# Patient Record
Sex: Male | Born: 1995 | Hispanic: No | Marital: Single | Smoking: Never smoker
Health system: Southern US, Community
[De-identification: ages and names within clinical notes are randomized; demographics above are authoritative.]

---
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
## 2013-09-01 ENCOUNTER — Emergency Department (HOSPITAL_COMMUNITY): Payer: BC Managed Care – PPO

## 2013-09-01 ENCOUNTER — Emergency Department (HOSPITAL_COMMUNITY)
Admission: EM | Admit: 2013-09-01 | Discharge: 2013-09-01 | Disposition: A | Discharge status: Home / Self Care | Payer: BC Managed Care – PPO | Attending: Emergency Medicine | Admitting: Emergency Medicine

## 2013-09-01 ENCOUNTER — Encounter (HOSPITAL_COMMUNITY): Payer: Self-pay | Admitting: Emergency Medicine

## 2013-09-01 DIAGNOSIS — S0100XA Unspecified open wound of scalp, initial encounter: Secondary | ICD-10-CM | POA: Insufficient documentation

## 2013-09-01 DIAGNOSIS — S0101XA Laceration without foreign body of scalp, initial encounter: Secondary | ICD-10-CM

## 2013-09-01 DIAGNOSIS — Y9389 Activity, other specified: Secondary | ICD-10-CM | POA: Insufficient documentation

## 2013-09-01 DIAGNOSIS — Y92410 Unspecified street and highway as the place of occurrence of the external cause: Secondary | ICD-10-CM | POA: Insufficient documentation

## 2013-09-01 NOTE — ED Notes (Signed)
Returned from CT.
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
## 2013-09-01 NOTE — ED Provider Notes (Signed)
Medical screening examination/treatment/procedure(s) were performed by non-physician practitioner and as supervising physician I was immediately available for consultation/collaboration.  EKG Interpretation   None        Juliet Rude. Rubin Payor, MD 09/01/13 [phone]
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
## 2013-09-08 ENCOUNTER — Emergency Department (HOSPITAL_COMMUNITY)
Admission: EM | Admit: 2013-09-08 | Discharge: 2013-09-08 | Disposition: A | Discharge status: Home / Self Care | Payer: BC Managed Care – PPO | Attending: Emergency Medicine | Admitting: Emergency Medicine

## 2013-09-08 ENCOUNTER — Encounter (HOSPITAL_COMMUNITY): Payer: Self-pay | Admitting: Emergency Medicine

## 2013-09-08 DIAGNOSIS — Z4802 Encounter for removal of sutures: Secondary | ICD-10-CM | POA: Insufficient documentation

## 2013-09-08 NOTE — ED Notes (Signed)
Pt reports he needs to have staples removed from his head that were placed last week.

## 2013-09-08 NOTE — ED Provider Notes (Signed)
CSN: [id number]     Arrival date & time 09/08/13  7846 History   First MD Initiated Contact with Patient 09/08/13 [phone]     Chief Complaint  Patient presents with  . Suture / Staple Removal   (Consider location/radiation/quality/duration/timing/severity/associated sxs/prior Treatment) Patient is a 17 y.o. male presenting with suture removal.  Suture / Staple Removal This is a new problem. The current episode started in the past 7 days. The problem occurs constantly. The problem has been unchanged. Nothing aggravates the symptoms. He has tried nothing for the symptoms.  Pt here for staple removal  History reviewed. No pertinent past medical history. History reviewed. No pertinent past surgical history. History reviewed. No pertinent family history. History  Substance Use Topics  . Smoking status: Never Smoker   . Smokeless tobacco: Not on file  . Alcohol Use: No    Review of Systems  Skin: Positive for wound.  All other systems reviewed and are negative.    Allergies  Review of patient's allergies indicates no known allergies.  Home Medications  No current outpatient prescriptions on file. BP 129/73  Pulse 72  Temp(Src) 97.6 F (36.4 C) (Oral)  Resp 18  SpO2 100% Physical Exam  Constitutional: He is oriented to person, place, and time. He appears well-developed and well-nourished.  HENT:  Head: Normocephalic and atraumatic.  Neurological: He is alert and oriented to person, place, and time.  Skin: Skin is warm.  Healed stapled areas scalp   Psychiatric: He has a normal mood and affect.    ED Course  Procedures (including critical care time) Labs Review Labs Reviewed - No data to display Imaging Review No results found.  EKG Interpretation   None       MDM   1. Removal of staple        Elson Areas, [place] 09/08/13 5284

## 2013-09-09 NOTE — ED Provider Notes (Signed)
Medical screening examination/treatment/procedure(s) were performed by non-physician practitioner and as supervising physician I was immediately available for consultation/collaboration.  EKG Interpretation   None        Derwood Kaplan, MD 09/09/13 [phone]
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
## 2015-03-05 ENCOUNTER — Emergency Department (HOSPITAL_BASED_OUTPATIENT_CLINIC_OR_DEPARTMENT_OTHER)
Admission: EM | Admit: 2015-03-05 | Discharge: 2015-03-05 | Disposition: A | Discharge status: Home / Self Care | Payer: Worker's Compensation | Attending: Emergency Medicine | Admitting: Emergency Medicine

## 2015-03-05 ENCOUNTER — Encounter (HOSPITAL_BASED_OUTPATIENT_CLINIC_OR_DEPARTMENT_OTHER): Payer: Self-pay | Admitting: *Deleted

## 2015-03-05 ENCOUNTER — Emergency Department (HOSPITAL_BASED_OUTPATIENT_CLINIC_OR_DEPARTMENT_OTHER): Payer: Worker's Compensation

## 2015-03-05 DIAGNOSIS — W272XXA Contact with scissors, initial encounter: Secondary | ICD-10-CM | POA: Insufficient documentation

## 2015-03-05 DIAGNOSIS — Y9389 Activity, other specified: Secondary | ICD-10-CM | POA: Diagnosis not present

## 2015-03-05 DIAGNOSIS — Y990 Civilian activity done for income or pay: Secondary | ICD-10-CM | POA: Diagnosis not present

## 2015-03-05 DIAGNOSIS — Y9289 Other specified places as the place of occurrence of the external cause: Secondary | ICD-10-CM | POA: Diagnosis not present

## 2015-03-05 DIAGNOSIS — S61412A Laceration without foreign body of left hand, initial encounter: Secondary | ICD-10-CM | POA: Insufficient documentation

## 2015-03-05 MED ORDER — LIDOCAINE HCL (PF) 1 % IJ SOLN
5.0000 mL | Freq: Once | INTRAMUSCULAR | Status: AC
  Administered 2015-03-05: 5 mL
  Filled 2015-03-05: qty 5

## 2015-03-05 NOTE — Discharge Instructions (Signed)
Have the sutures removed in 10 days by the nurse at work. Follow up sooner for any signs of infection Laceration Care, Adult A laceration is a cut that goes through all layers of the skin. The cut goes into the tissue beneath the skin. HOME CARE For stitches (sutures) or staples:  Keep the cut clean and dry.  If you have a bandage (dressing), change it at least once a day. Change the bandage if it gets wet or dirty, or as told by your doctor.  Wash the cut with soap and water 2 times a day. Rinse the cut with water. Pat it dry with a clean towel.  Put a thin layer of medicated cream on the cut as told by your doctor.  You may shower after the first 24 hours. Do not soak the cut in water until the stitches are removed.  Only take medicines as told by your doctor.  Have your stitches or staples removed as told by your doctor. For skin adhesive strips:  Keep the cut clean and dry.  Do not get the strips wet. You may take a bath, but be careful to keep the cut dry.  If the cut gets wet, pat it dry with a clean towel.  The strips will fall off on their own. Do not remove the strips that are still stuck to the cut. For wound glue:  You may shower or take baths. Do not soak or scrub the cut. Do not swim. Avoid heavy sweating until the glue falls off on its own. After a shower or bath, pat the cut dry with a clean towel.  Do not put medicine on your cut until the glue falls off.  If you have a bandage, do not put tape over the glue.  Avoid lots of sunlight or tanning lamps until the glue falls off. Put sunscreen on the cut for the first year to reduce your scar.  The glue will fall off on its own. Do not pick at the glue. You may need a tetanus shot if:  You cannot remember when you had your last tetanus shot.  You have never had a tetanus shot. If you need a tetanus shot and you choose not to have one, you may get tetanus. Sickness from tetanus can be serious. GET HELP RIGHT  AWAY IF:   Your pain does not get better with medicine.  Your arm, hand, leg, or foot loses feeling (numbness) or changes color.  Your cut is bleeding.  Your joint feels weak, or you cannot use your joint.  You have painful lumps on your body.  Your cut is red, puffy (swollen), or painful.  You have a red line on the skin near the cut.  You have yellowish-white fluid (pus) coming from the cut.  You have a fever.  You have a bad smell coming from the cut or bandage.  Your cut breaks open before or after stitches are removed.  You notice something coming out of the cut, such as wood or glass.  You cannot move a finger or toe. MAKE SURE YOU:   Understand these instructions.  Will watch your condition.  Will get help right away if you are not doing well or get worse. Document Released: 03/07/2008 Document Revised: 12/12/2011 Document Reviewed: 03/15/2011 [facility] Patient Information 2015 [employer], [place]. This information is not intended to replace advice given to you by your health care provider. Make sure you discuss any questions you have with your health care  provider.

## 2015-03-05 NOTE — ED Notes (Signed)
Workersman comp.  Pt cut his left pinky finger at work.

## 2015-03-05 NOTE — ED Provider Notes (Signed)
CSN: [id number]     Arrival date & time 03/05/15  1936 History   First MD Initiated Contact with Patient 03/05/15 2024     Chief Complaint  Patient presents with  . Extremity Laceration     (Consider location/radiation/quality/duration/timing/severity/associated sxs/prior Treatment) HPI Comments: Pt comes in with c/o laceration to the left hand. Pt states that he was cutting with scissors and cut his hand. Denies problems with movement of finger. No numbness or weakness. tdap is utd  The history is provided by the patient. No language interpreter was used.    History reviewed. No pertinent past medical history. History reviewed. No pertinent past surgical history. History reviewed. No pertinent family history. History  Substance Use Topics  . Smoking status: Never Smoker   . Smokeless tobacco: Not on file  . Alcohol Use: No    Review of Systems  All other systems reviewed and are negative.     Allergies  Review of patient's allergies indicates no known allergies.  Home Medications   Prior to Admission medications   Not on File   BP 126/74 mmHg  Pulse 90  Temp(Src) 98.9 F (37.2 C) (Oral)  Resp 18  Ht 6' (1.829 m)  Wt 150 lb (68.04 kg)  BMI 20.34 kg/m2  SpO2 100% Physical Exam  Constitutional: He is oriented to person, place, and time. He appears well-developed and well-nourished.  Cardiovascular: Normal rate and regular rhythm.   Pulmonary/Chest: Effort normal and breath sounds normal.  Neurological: He is alert and oriented to person, place, and time. He exhibits normal muscle tone. Coordination normal.  Skin:  Laceration to the left dorsal aspect of the right hand. Full rom of all finger. Neurovascularly intact  Nursing note and vitals reviewed.   ED Course  LACERATION REPAIR Date/Time: 03/05/2015 9:35 PM Performed by: Teressa LowerPICKERING, Ori Trejos Authorized by: Teressa LowerPICKERING, Eiden Bagot Consent: Verbal consent obtained. Consent given by: patient Patient identity  confirmed: verbally with patient Body area: upper extremity Location details: left hand Laceration length: 2 cm Foreign bodies: no foreign bodies Anesthesia: local infiltration Local anesthetic: lidocaine 2% without epinephrine Irrigation solution: saline Irrigation method: syringe Amount of cleaning: standard Skin closure: 4-0 Prolene Number of sutures: 4 Technique: simple Approximation: close Approximation difficulty: simple Dressing: 4x4 sterile gauze Patient tolerance: Patient tolerated the procedure well with no immediate complications   (including critical care time) Labs Review Labs Reviewed - No data to display  Imaging Review Dg Hand Complete Left  03/05/2015   CLINICAL DATA:  Left hand pain after a scissor injury. Laceration along the palm.  EXAM: LEFT HAND - COMPLETE 3+ VIEW  COMPARISON:  None.  FINDINGS: No foreign body aside from the bandaging. No unexpected gas tracking along the soft tissues. No bony abnormality.  IMPRESSION: 1. No bony abnormality or gas identified in the soft tissues, although soft tissue detail is obscured by the patient's bandaging. No foreign body.   Electronically Signed   By: Gaylyn RongWalter  Liebkemann M.D.   On: 03/05/2015 21:12     EKG Interpretation None      MDM   Final diagnoses:  Hand laceration, left, initial encounter    Wound closed without any problem. No sign of tendon injury. Discussed wound care and return precautions with pt    Teressa LowerVrinda Kensy Blizard, NP 03/05/15 2136  Vanetta MuldersScott Zackowski, MD 03/05/15 2320

## 2015-03-05 NOTE — ED Notes (Signed)
U[facility] completed  
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
## 2016-10-17 ENCOUNTER — Ambulatory Visit (INDEPENDENT_AMBULATORY_CARE_PROVIDER_SITE_OTHER): Payer: BLUE CROSS/BLUE SHIELD | Admitting: Physician Assistant

## 2016-10-17 VITALS — BP 122/80 | HR 81 | Temp 97.5°F | Resp 16 | Ht 71.0 in | Wt 144.0 lb

## 2016-10-17 DIAGNOSIS — L600 Ingrowing nail: Secondary | ICD-10-CM

## 2016-10-17 MED ORDER — IBUPROFEN 800 MG PO TABS: 800.0000 mg | ORAL_TABLET | Freq: Three times a day (TID) | ORAL | 0 refills | Status: AC | PRN

## 2016-10-17 MED ORDER — CEPHALEXIN 500 MG PO CAPS: 500.0000 mg | ORAL_CAPSULE | Freq: Two times a day (BID) | ORAL | 0 refills | Status: AC

## 2016-10-17 NOTE — Patient Instructions (Addendum)
Please keep the xeroform (yellow gauze) on the toe nail as long as possible.  You will shower as you normally do, and just pat the toe dry.   Take the antibiotic as needed.   Keep covered for the next 24 hours, then you can shower with the toe exposed.  Fingernail or Toenail Removal, Adult, Care After This sheet gives you information about how to care for yourself after your procedure. Your health care provider may also give you more specific instructions. If you have problems or questions, contact your health care provider. What can I expect after the procedure? After the procedure, it is common to have:  Pain.  Redness.  Swelling.  Soreness. Follow these instructions at home:  If you have a splint:  Do not put pressure on any part of the splint until it is fully hardened. This may take several hours.  Wear the splint as told by your health care provider. Remove it only as told by your health care provider.  Loosen the splint if your fingers or toes tingle, become numb, or turn cold and blue.  Keep the splint clean.  If the splint is not waterproof:  Do not let it get wet.  Cover it with a watertight covering when you take a bath or a shower. Wound care  Follow instructions from your health care provider about how to take care of your wound. Make sure you:  Wash your hands with soap and water before you change your bandage (dressing). If soap and water are not available, use hand sanitizer.  Change your dressing as told by your health care provider.  Keep your dressing dry until your health care provider says it can be removed.  Leave stitches (sutures), skin glue, or adhesive strips in place. These skin closures may need to stay in place for 2 weeks or longer. If adhesive strip edges start to loosen and curl up, you may trim the loose edges. Do not remove adhesive strips completely unless your health care provider tells you to do that.  Check your wound every day for  signs of infection. Check for:  More redness, swelling, or pain.  More fluid or blood.  Warmth.  Pus or a bad smell. Managing pain, stiffness, and swelling  Move your fingers or toes often to avoid stiffness and to lessen swelling.  Raise (elevate) the injured area above the level of your heart while you are sitting or lying down. You may need to keep your finger or toe raised or supported on a pillow for 24 hours or as told by your health care provider.  Soak your hand or foot in warm, soapy water for 10-20 minutes, 3 times a day or as told by your health care provider. Medicine  Take over-the-counter and prescription medicines only as told by your health care provider.  If you were prescribed an antibiotic medicine, use it as told by your health care provider. Do not stop using the antibiotic even if your condition improves. General instructions  If you were given a shoe to wear, wear it as told by your health care provider.  Keep all follow-up visits as told by your health care provider. This is important. Contact a health care provider if:  You have more redness, swelling, or pain around your wound.  You have more fluid or blood coming from your wound.  Your wound feels warm to the touch.  You have pus or a bad smell coming from your wound.  You  have a fever.  Your finger or toe looks blue or black. This information is not intended to replace advice given to you by your health care provider. Make sure you discuss any questions you have with your health care provider. Document Released: 10/10/2014 Document Revised: 05/18/2016 Document Reviewed: 03/28/2016 Elsevier Interactive Patient Education  2017 [employer]     IF you received an x-ray today, you will receive an invoice from [facility]. Please contact [facility] at [phone] with questions or concerns regarding your invoice.   IF you received labwork today, you will receive an invoice  from [facility]. Please contact [facility] at [phone] with questions or concerns regarding your invoice.   Our billing staff will not be able to assist you with questions regarding bills from these companies.  You will be contacted with the lab results as soon as they are available. The fastest way to get your results is to activate your My Chart account. Instructions are located on the last page of this paperwork. If you have not heard from us regarding the results in 2 weeks, please contact this office.

## 2016-10-17 NOTE — Progress Notes (Addendum)
[facility] [address], [place] [address] [phone]  Date:  10/17/2016   Name:  Gene Gilbert   DOB:  05-Jul-1996   MRN:  [id number]  PCP:  No PCP Per Patient    History of Present Illness:  Gene Gilbert is a 21 y.o. male patient who presents to [facility] for chief complaint of left great toe pain. Patient reports that his great toe on the left side has been agitated and painful intermittently for the last year. He reports that he has ingrown toenails that have been pretty frequently. He generally cuts them down. In the last month, he has noticed increased pain so, swelling, and redness to the area. The toe bleeds often. He has no fever or red streaking. He has noticed swelling.     There are no active problems to display for this patient.   No past medical history on file.  No past surgical history on file.  Social History  Substance Use Topics  . Smoking status: Never Smoker  . Smokeless tobacco: Never Used  . Alcohol use No    No family history on file.  No Known Allergies  Medication list has been reviewed and updated.  No current outpatient prescriptions on file prior to visit.   No current facility-administered medications on file prior to visit.     ROS ROS otherwise unremarkable unless listed above.   Physical Examination: BP 122/80 (BP Location: Right Arm, Patient Position: Sitting, Cuff Size: Normal)   Pulse 81   Temp 97.5 F (36.4 C) (Oral)   Resp 16   Ht 5\' 11"  (1.803 m)   Wt 144 lb (65.3 kg)   SpO2 100%   BMI 20.08 kg/m  Ideal Body Weight: Weight in (lb) to have BMI = 25: 178.9  Physical Exam  Constitutional: He is oriented to person, place, and time. He appears well-developed and well-nourished. No distress.  HENT:  Head: Normocephalic and atraumatic.  Eyes: Conjunctivae and EOM are normal. Pupils are equal, round, and reactive to light.  Cardiovascular: Normal rate.   Pulmonary/Chest: Effort normal. No respiratory  distress.  Musculoskeletal:  Left great toe: left side of the nail bed with erythema and dry sanguinous material.  Granulomatous tissue along the left side of the nail bed that appears overgrown consistent with an ingrown toenail. This is tender. There is also clear drainage from the more proximal area of the nail bed.   Neurological: He is alert and oriented to person, place, and time.  Skin: Skin is warm and dry. He is not diaphoretic.  Psychiatric: He has a normal mood and affect. His behavior is normal.   Procedure: Verbal consent obtained. Toe was cleansed with soap and water. Toe swabbed with alcohol prior to anesthesia with digital block of the first great toe using 0.5% bupivacaine. Anesthesia obtained. Nail cutter applied to place a one third cut to remove one third of the nail bed. There is mild purulent drainage and sanguinous material. Granulomatous tissue curettaged. Cleansed with soap and water. Xeroform placed along the exposed matrix. Dressings then applied.   Assessment and Plan: Gene Gilbert is a 21 y.o. male who is here today for chief complaint of toenail pain. Ingrown toenail was removed without complication. Advised wound care. He is given Keflex today to treat possible infection. Advised to take to completion. Follow-up as needed. Alarming symptoms to warrant immediate return discussed. 1. Ingrown toenail   Trena Platt, PA-C [facility] 10/18/2016 10:46 AM

## 2017-01-24 ENCOUNTER — Telehealth: Payer: Self-pay | Admitting: General Practice

## 2017-01-24 DIAGNOSIS — N912 Amenorrhea, unspecified: Secondary | ICD-10-CM

## 2017-01-24 DIAGNOSIS — Q5122 Other partial doubling of uterus: Secondary | ICD-10-CM

## 2017-01-24 NOTE — Telephone Encounter (Signed)
Pt is needing to talk with mani about that he has not heard from anyone about his referral and it has been two weeks   Best number [phone]

## 2017-01-25 NOTE — Telephone Encounter (Signed)
[email or web address]

## 2017-01-25 NOTE — Telephone Encounter (Signed)
I see nothing in epic? Last seen 10/2016

## 2017-01-26 NOTE — Telephone Encounter (Signed)
Referral placed. Please schedule patient and let them know when their appointment is.

## 2017-02-09 ENCOUNTER — Ambulatory Visit: Payer: BLUE CROSS/BLUE SHIELD | Admitting: Obstetrics & Gynecology

## 2017-02-09 NOTE — Telephone Encounter (Signed)
[facility] was [facility] and has cancelled this apt.
